# Patient Record
Sex: Male | Born: 1975 | Race: Black or African American | Hispanic: No | Marital: Single | Smoking: Never smoker
Health system: Southern US, Community
[De-identification: ages and names within clinical notes are randomized; demographics above are authoritative.]

---

## 2005-10-29 ENCOUNTER — Emergency Department (HOSPITAL_COMMUNITY): Admission: EM | Admit: 2005-10-29 | Discharge: 2005-10-29 | Payer: Self-pay | Admitting: Emergency Medicine

## 2005-10-30 ENCOUNTER — Emergency Department: Payer: Self-pay | Admitting: Internal Medicine

## 2005-10-31 ENCOUNTER — Emergency Department (HOSPITAL_COMMUNITY): Admission: EM | Admit: 2005-10-31 | Discharge: 2005-10-31 | Payer: Self-pay | Admitting: Emergency Medicine

## 2006-03-29 ENCOUNTER — Emergency Department: Payer: Self-pay | Admitting: General Practice

## 2006-08-30 ENCOUNTER — Emergency Department: Payer: Self-pay | Admitting: Emergency Medicine

## 2006-08-30 ENCOUNTER — Other Ambulatory Visit: Payer: Self-pay

## 2007-03-10 ENCOUNTER — Emergency Department: Payer: Self-pay | Admitting: Internal Medicine

## 2007-06-25 ENCOUNTER — Emergency Department (HOSPITAL_COMMUNITY): Admission: EM | Admit: 2007-06-25 | Discharge: 2007-06-25 | Payer: Self-pay | Admitting: Emergency Medicine

## 2007-06-29 ENCOUNTER — Emergency Department (HOSPITAL_COMMUNITY): Admission: EM | Admit: 2007-06-29 | Discharge: 2007-06-29 | Payer: Self-pay | Admitting: Emergency Medicine

## 2009-07-13 ENCOUNTER — Ambulatory Visit: Payer: Self-pay | Admitting: General Surgery

## 2009-12-14 ENCOUNTER — Emergency Department: Payer: Self-pay | Admitting: Emergency Medicine

## 2009-12-15 ENCOUNTER — Telehealth: Payer: Self-pay | Admitting: Internal Medicine
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
## 2010-05-15 NOTE — Progress Notes (Signed)
Summary: ETT  Phone Note Outgoing Call   Call placed by: Benedict Needy, RN,  December 15, 2009 12:20 PM Call placed to: Patient Summary of Call: ER called and asked Dr. Gala Romney to schedule pt for ETT. This nurse called to schedule that test LMOM TCB.  Initial call taken by: Benedict Needy, RN,  December 15, 2009 12:21 PM

## 2010-11-20 ENCOUNTER — Emergency Department: Payer: Self-pay | Admitting: Emergency Medicine

## 2012-07-28 IMAGING — CR DG CHEST 2V
1 series · 3 of 3 positions shown · non-contrast
Comparison: none

REASON FOR EXAM: chest pain
COMMENTS:

[Series 1: view not recorded · 0.17mm/px · 3 of 3 slices shown]
[im 1/3]
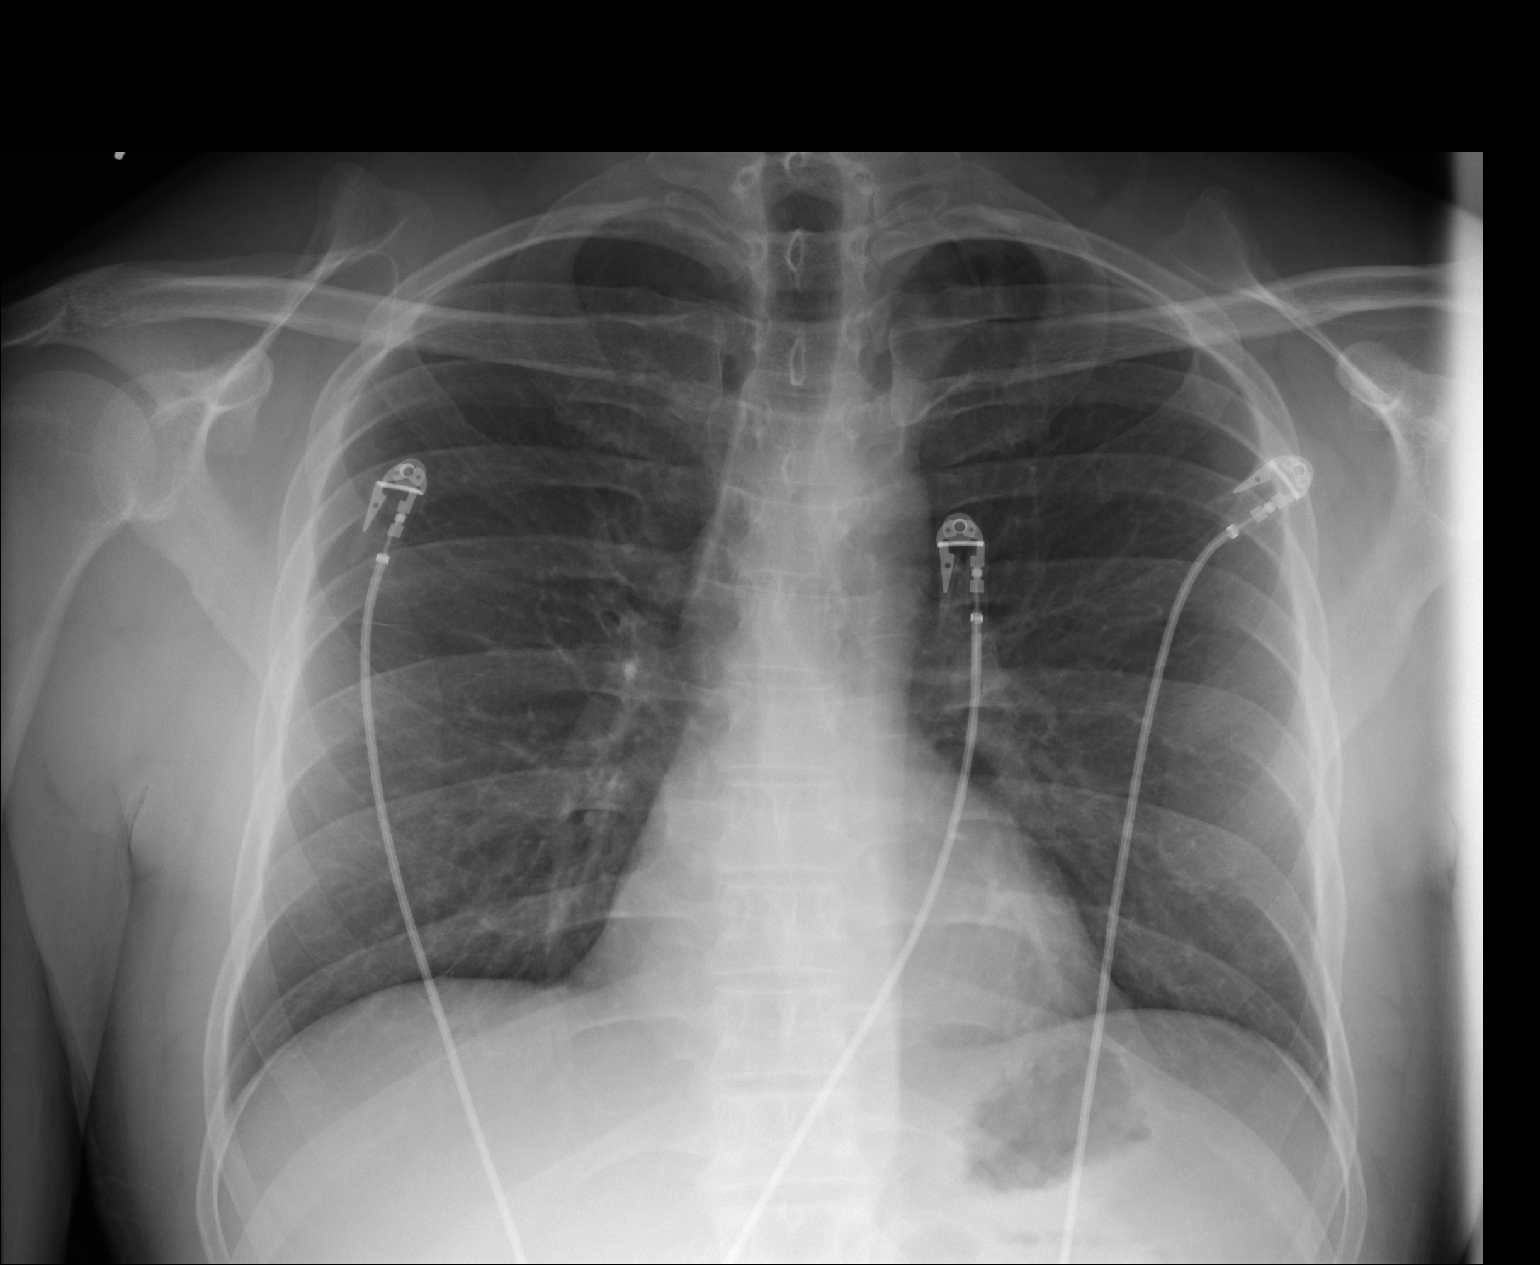
[im 2/3]
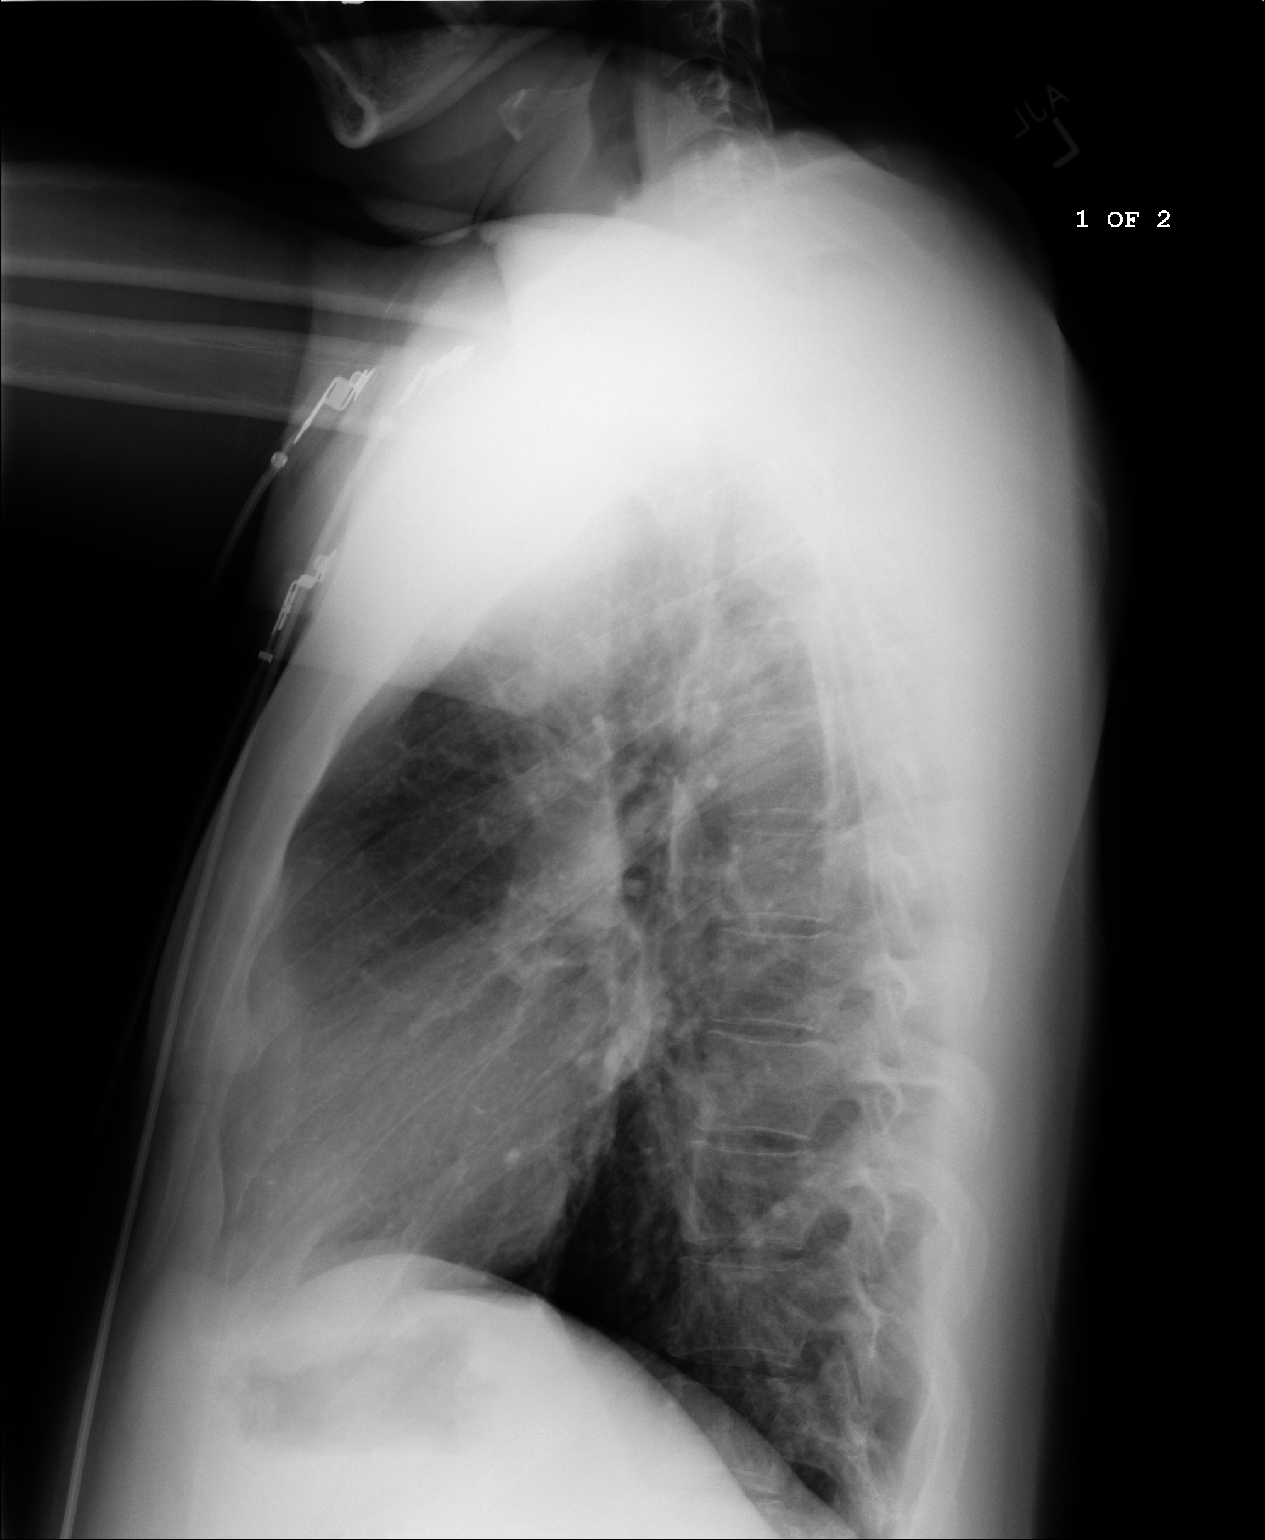
[im 3/3]
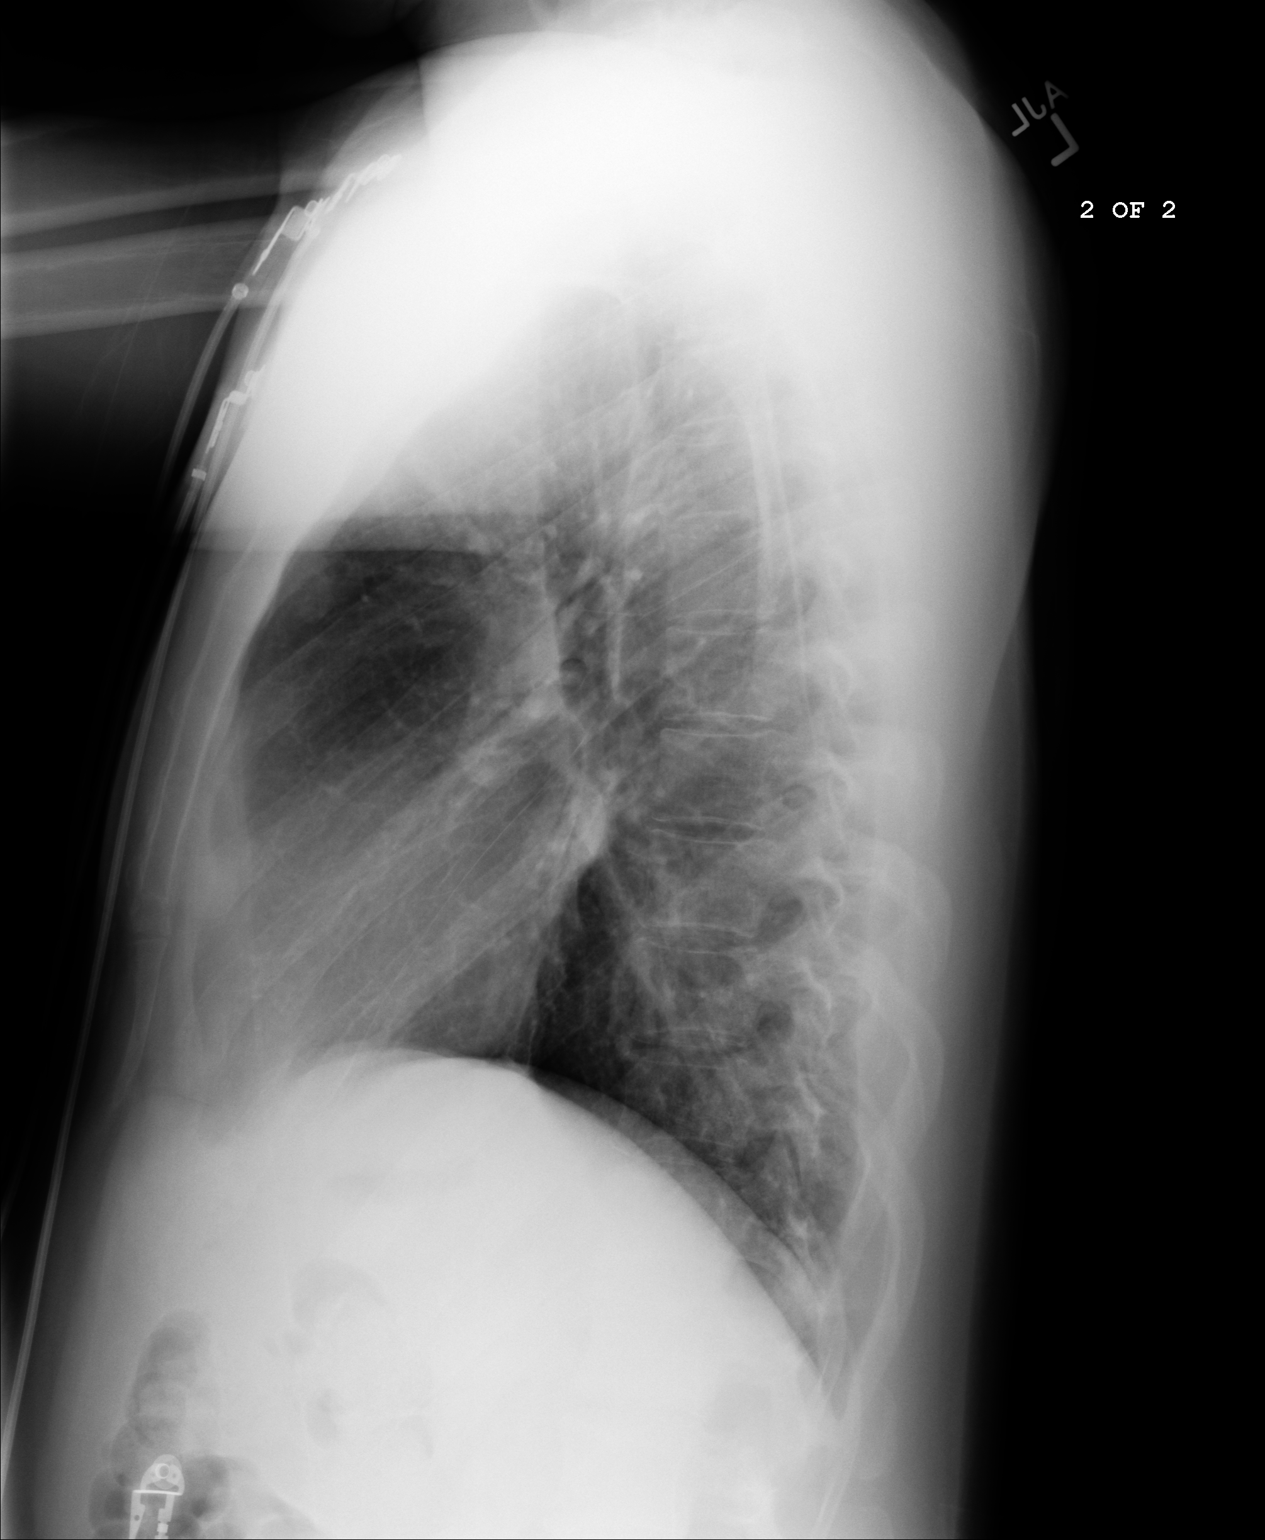

[3 of 3 positions shown; findings below may reference images not displayed]

PROCEDURE:     DXR - DXR CHEST PA (OR AP) AND LATERAL  - December 14, 2009 [DATE]

RESULT:     Comparison is made to the prior exam 08/30/2006. No significant
interval changes are seen. The lung fields are clear. The heart, mediastinal
and osseous structures show no significant abnormalities. Monitoring
electrodes are present.
IMPRESSION: No significant abnormalities are noted.

## 2019-04-02 DIAGNOSIS — Z20828 Contact with and (suspected) exposure to other viral communicable diseases: Secondary | ICD-10-CM | POA: Diagnosis not present

## 2019-04-16 HISTORY — PX: HERNIA REPAIR: SHX51

## 2019-09-09 DIAGNOSIS — K047 Periapical abscess without sinus: Secondary | ICD-10-CM | POA: Diagnosis not present

## 2021-02-24 DIAGNOSIS — J101 Influenza due to other identified influenza virus with other respiratory manifestations: Secondary | ICD-10-CM | POA: Diagnosis not present

## 2021-02-24 DIAGNOSIS — R509 Fever, unspecified: Secondary | ICD-10-CM | POA: Diagnosis not present

## 2022-01-04 ENCOUNTER — Telehealth: Payer: Self-pay
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
## 2022-01-11 ENCOUNTER — Telehealth: Payer: Self-pay
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
## 2022-01-29 ENCOUNTER — Telehealth: Payer: Self-pay
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
## 2024-04-30 ENCOUNTER — Other Ambulatory Visit: Payer: Self-pay

## 2024-04-30 ENCOUNTER — Encounter: Payer: Self-pay | Admitting: Emergency Medicine

## 2024-04-30 ENCOUNTER — Emergency Department

## 2024-04-30 DIAGNOSIS — Y92410 Unspecified street and highway as the place of occurrence of the external cause: Secondary | ICD-10-CM | POA: Insufficient documentation

## 2024-04-30 DIAGNOSIS — M25511 Pain in right shoulder: Secondary | ICD-10-CM | POA: Diagnosis present
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
## 2024-05-01 ENCOUNTER — Emergency Department
Admission: EM | Admit: 2024-05-01 | Discharge: 2024-05-01 | Disposition: A | Discharge status: Home / Self Care | Attending: Emergency Medicine | Admitting: Emergency Medicine

## 2024-05-01 DIAGNOSIS — M25511 Pain in right shoulder: Secondary | ICD-10-CM

## 2024-05-01 MED ORDER — KETOROLAC TROMETHAMINE 30 MG/ML IJ SOLN
30.0000 mg | Freq: Once | INTRAMUSCULAR | Status: AC
  Administered 2024-05-01: 30 mg via INTRAMUSCULAR
  Filled 2024-05-01: qty 1

## 2024-05-01 MED ORDER — KETOROLAC TROMETHAMINE 10 MG PO TABS: 10.0000 mg | ORAL_TABLET | Freq: Three times a day (TID) | ORAL | 0 refills | Status: AC | PRN
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
